# Patient Record
Sex: Male | Born: 1970
Health system: Southern US, Community
[De-identification: ages and names within clinical notes are randomized; demographics above are authoritative.]

---

## 2009-11-30 ENCOUNTER — Encounter: Admission: RE | Admit: 2009-11-30 | Discharge: 2010-02-28 | Payer: Self-pay | Admitting: Family Medicine

## 2012-02-11 DIAGNOSIS — M7989 Other specified soft tissue disorders: Secondary | ICD-10-CM | POA: Insufficient documentation

## 2012-02-11 DIAGNOSIS — W2209XA Striking against other stationary object, initial encounter: Secondary | ICD-10-CM | POA: Insufficient documentation

## 2012-02-11 DIAGNOSIS — S92309A Fracture of unspecified metatarsal bone(s), unspecified foot, initial encounter for closed fracture: Secondary | ICD-10-CM | POA: Insufficient documentation

## 2012-02-11 DIAGNOSIS — Y92009 Unspecified place in unspecified non-institutional (private) residence as the place of occurrence of the external cause: Secondary | ICD-10-CM | POA: Insufficient documentation

## 2012-02-12 ENCOUNTER — Encounter (HOSPITAL_COMMUNITY): Payer: Self-pay

## 2012-02-12 ENCOUNTER — Emergency Department (HOSPITAL_COMMUNITY)
Admission: EM | Admit: 2012-02-12 | Discharge: 2012-02-12 | Disposition: A | Discharge status: Home / Self Care | Payer: 59 | Attending: Emergency Medicine | Admitting: Emergency Medicine

## 2012-02-12 ENCOUNTER — Emergency Department (HOSPITAL_COMMUNITY): Payer: 59

## 2012-02-12 DIAGNOSIS — S92352A Displaced fracture of fifth metatarsal bone, left foot, initial encounter for closed fracture: Secondary | ICD-10-CM

## 2012-02-12 MED ORDER — BUPIVACAINE HCL (PF) 0.5 % IJ SOLN
INTRAMUSCULAR | Status: AC
  Filled 2012-02-12: qty 30

## 2012-02-12 MED ORDER — LIDOCAINE HCL 1 % IJ SOLN
30.0000 mL | Freq: Once | INTRAMUSCULAR | Status: DC
  Filled 2012-02-12: qty 20

## 2012-02-12 MED ORDER — OXYCODONE-ACETAMINOPHEN 5-325 MG PO TABS
1.0000 | ORAL_TABLET | Freq: Once | ORAL | Status: AC
  Administered 2012-02-12: 1 via ORAL
  Filled 2012-02-12: qty 1

## 2012-02-12 MED ORDER — IBUPROFEN 800 MG PO TABS
800.0000 mg | ORAL_TABLET | Freq: Once | ORAL | Status: AC
  Administered 2012-02-12: 800 mg via ORAL
  Filled 2012-02-12: qty 1

## 2012-02-12 MED ORDER — OXYCODONE-ACETAMINOPHEN 5-325 MG PO TABS: 1.0000 | ORAL_TABLET | ORAL | Status: AC | PRN

## 2012-02-12 NOTE — ED Notes (Signed)
Pt hit his left pinky toe on a door

## 2012-02-12 NOTE — ED Provider Notes (Signed)
History     CSN: [id number]  Arrival date & time 02/11/12  2235   First MD Initiated Contact with Patient 02/12/12 0106      Chief Complaint  Patient presents with  . Toe Pain    HPI: Patient is a 41 y.o. male presenting with toe pain. The history is provided by the patient.  Toe Pain This is a new problem. The current episode started yesterday. The problem has been gradually worsening. The symptoms are aggravated by walking.  Toe Pain This is a new problem. The current episode started yesterday. The problem has been gradually worsening. The symptoms are aggravated by walking.   patient reports that at approximately 10 PM this evening he was going through a doorway in his home relatively quickly and hit his left 5th toe on the molding the doorway. States originally the toe was more deformed in appearance and is now. Reports increasing pain and swelling.  History reviewed. No pertinent past medical history.  History reviewed. No pertinent past surgical history.  History reviewed. No pertinent family history.  History  Substance Use Topics  . Smoking status: Not on file  . Smokeless tobacco: Not on file  . Alcohol Use: No      Review of Systems  Constitutional: Negative.   HENT: Negative.   Eyes: Negative.   Respiratory: Negative.   Cardiovascular: Negative.   Gastrointestinal: Negative.   Genitourinary: Negative.   Musculoskeletal: Negative.   Skin: Negative.   Neurological: Negative.   Hematological: Negative.   Psychiatric/Behavioral: Negative.     Allergies  Review of patient's allergies indicates no known allergies.  Home Medications  No current outpatient prescriptions on file.  BP 134/92  Pulse 83  Temp(Src) 98.6 F (37 C) (Oral)  Resp 16  SpO2 98%  Physical Exam  Constitutional: He appears well-developed and well-nourished.  HENT:  Head: Normocephalic and atraumatic.  Eyes: Conjunctivae are normal.  Cardiovascular: Normal rate.     Pulmonary/Chest: Effort normal.  Musculoskeletal: Normal range of motion.       Feet:  Neurological: He is alert.  Skin: Skin is warm and dry.  Psychiatric: He has a normal mood and affect.    ED Course  Reduction of fracture Date/Time: 02/12/2012 4:30 AM Performed by: Leanne Chang Authorized by: Leanne Chang Consent: Verbal consent obtained. Risks and benefits: risks, benefits and alternatives were discussed Consent given by: patient Patient understanding: patient states understanding of the procedure being performed Required items: required blood products, implants, devices, and special equipment available Patient identity confirmed: verbally with patient Local anesthesia used: yes Anesthesia: digital block Local anesthetic: lidocaine 1% with epinephrine and bupivacaine 0.5% with epinephrine Anesthetic total: 3 ml Patient tolerance: Patient tolerated the procedure well with no immediate complications.   X-ray of the left fifth toe shows fracture of distal mid-metatarsal with approximately 45 angulation. Will attempt reduction. (See above) Dr. Dierdre Highman has reviewed the x-ray with me and is in agreement with plan.   Postreduction x-ray shows left fifth metatarsal in much better alignment status post reduction. Will buddy-tape and place in postop boot and encourage close follow up with his orthopedist. Patient is agreeable with plan     Labs Reviewed - No data to display No results found.   No diagnosis found.    MDM  HPI/PE and clinical findings c/w 1. Fx (L) 5th metatarsal  (Postreduction x-ray shows a much improved alignment of the left fifth metatarsal from approximately 45 angulation to approximately 10-15 angulation.  Betty tape applied and patient placed in postop boot. He has been instructed to followup with his orthopedist for reevaluation).      0  Leanne Chang, NP 02/12/12 5188  Leanne Chang, NP 02/12/12 0522  Medical  screening examination/treatment/procedure(s) were performed by non-physician practitioner and as supervising physician I was immediately available for consultation/collaboration.  Sunnie Nielsen, MD 02/13/12 [phone]

## 2012-02-12 NOTE — Discharge Instructions (Signed)
Please review the instructions below. The x-ray tonight showed that you had a displaced fracture in the bone in your left fifth toe. We were successful in reducing the angulation of the fracture from approximately 45 to 10-15. We have placed you in a buddy tape splint and postop boot. Call tomorrow to arrange follow up with your orthopedic physician. We are prescribing a short course of medication for pain please take as directed. Keep elevated as much as possible. Referred to the [facility] the instructions.     Foot Fracture Your caregiver has diagnosed you as having a foot fracture (broken bone). Your foot has many bones. You have a fracture, or break, in one of these bones. In some cases, your doctor may put on a splint or removable fracture boot until the swelling in your foot has lessened. A cast may or may not be required. HOME CARE INSTRUCTIONS  If you do not have a cast or splint:  You may bear weight on your injured foot as tolerated or advised.   Do not put any weight on your injured foot for as long as directed by your caregiver. Slowly increase the amount of time you walk on the foot as the pain and swelling allows or as advised.   Use crutches until you can bear weight without pain. A gradual increase in weight bearing may help.   Apply ice to the injury for 15 to 20 minutes each hour while awake for the first 2 days. Put the ice in a plastic bag and place a towel between the bag of ice and your skin.   If an ace bandage (stretchy, elastic wrapping bandage) was applied, you may re-wrap it if ankle is more painful or your toes become cold and swollen.  If you have a cast or splint:  Use your crutches for as long as directed by your caregiver.   To lessen the swelling, keep the injured foot elevated on pillows while lying down or sitting. Elevate your foot above your heart.   Apply ice to the injury for 15 to 20 minutes each hour while awake for the first 2 days. Put the ice in a  plastic bag and place a thin towel between the bag of ice and your cast.   Plaster or fiberglass cast:   Do not try to scratch the skin under the cast using a sharp or pointed object down the cast.   Check the skin around the cast every day. You may put lotion on any red or sore areas.   Keep your cast clean and dry.   Plaster splint:   Wear the splint until you are seen for a follow-up examination.   You may loosen the elastic around the splint if your toes become numb, tingle, or turn blue or cold. Do not rest it on anything harder than a pillow in the first 24 hours.   Do not put pressure on any part of your splint. Use your crutches as directed.   Keep your splint dry. It can be protected during bathing with a plastic bag. Do not lower the splint into water.   If you have a fracture boot you may remove it to shower. Bear weight only as instructed by your caregiver.   Only take over-the-counter or prescription medicines for pain, discomfort, or fever as directed by your caregiver.  SEEK IMMEDIATE MEDICAL CARE IF:   Your cast gets damaged or breaks.   You have continued severe pain or more swelling  than you did before the cast was put on.   Your skin or nails of your casted foot turn blue, gray, feel cold or numb.   There is a bad smell from your cast.   There is severe pain with movement of your toes.   There are new stains and/or drainage coming from under the cast.  MAKE SURE YOU:   Understand these instructions.   Will watch your condition.   Will get help right away if you are not doing well or get worse.  Document Released: 11/08/2000 Document Revised: 10/31/2011 Document Reviewed: 12/15/2008 [facility] Patient Information 2012 [place], [place].

## 2013-10-04 IMAGING — CR DG TOE 5TH 2+V*L*
3 series · 3 of 3 positions shown · non-contrast
Comparison: None.

CLINICAL DATA: Left fifth toe pain.

DG TOE 5TH LEFT

[x toes ap left]
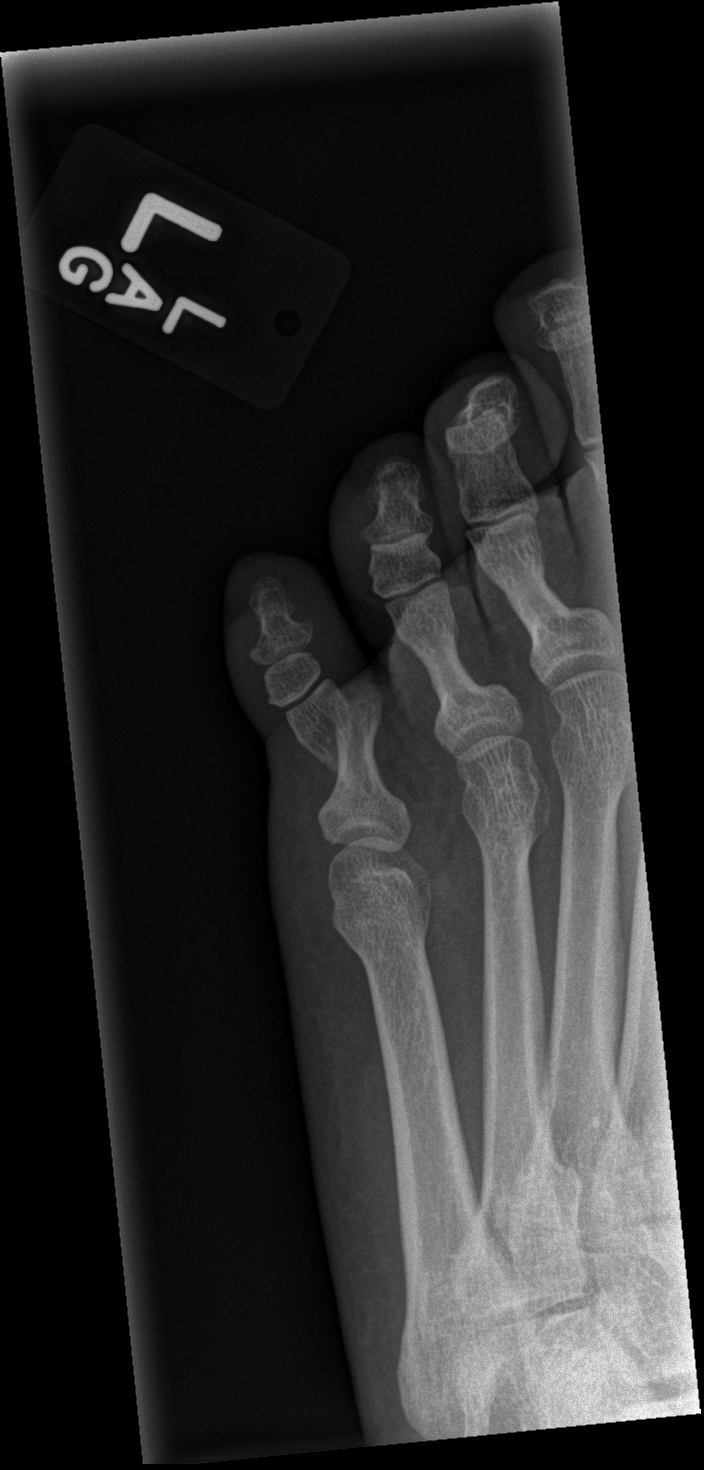

[x toes obl left]
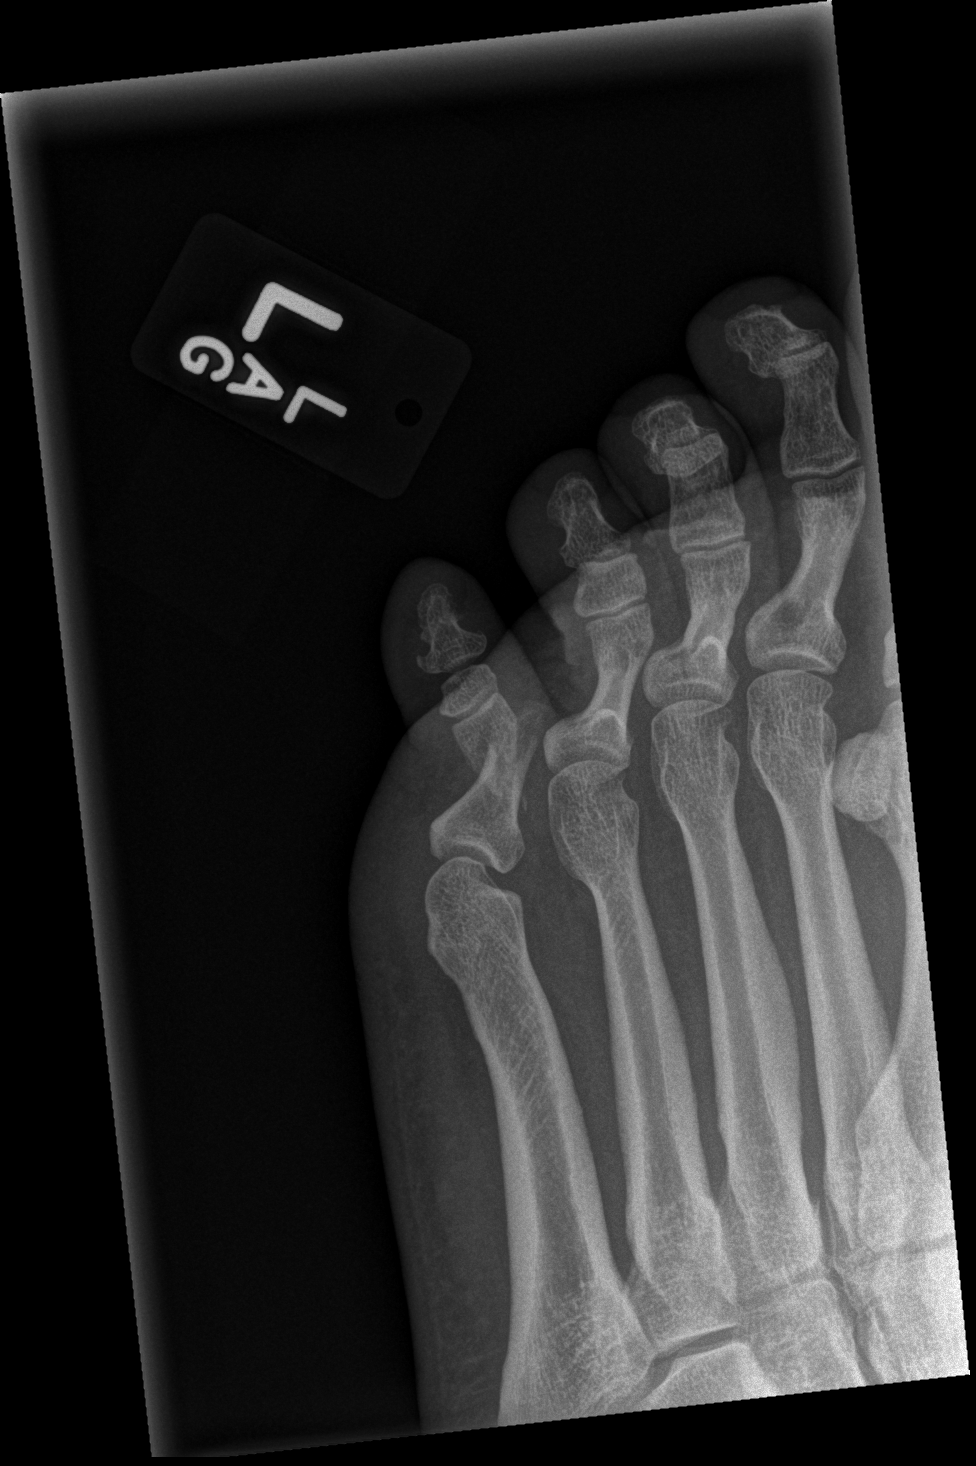

[x toes lat left]
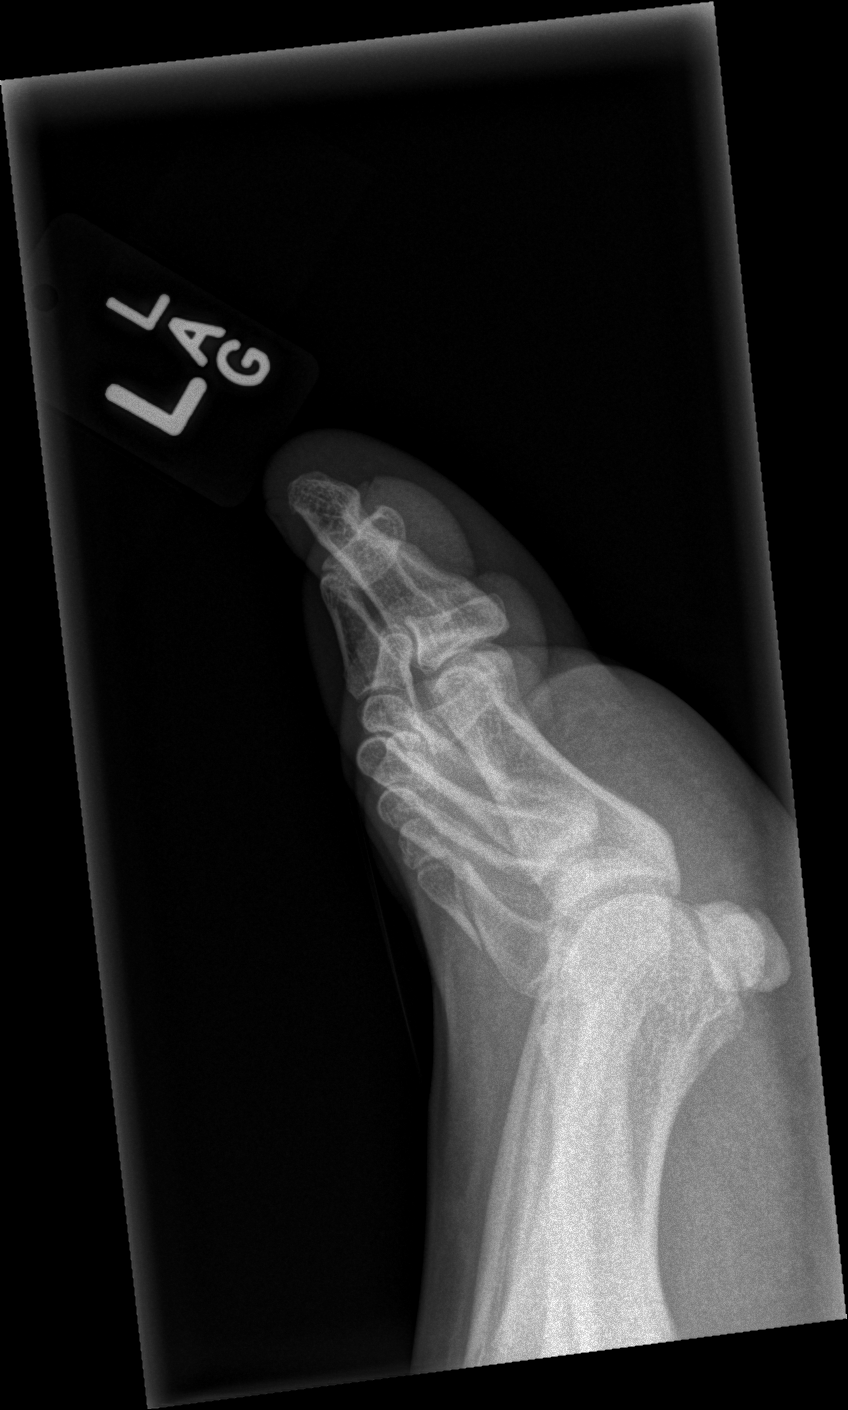

[3 of 3 positions shown; findings below may reference images not displayed]

FINDINGS: There is a significantly displaced fracture through the
fifth proximal phalanx, with lateral angulation.  No additional
fractures are seen.  The visualized joint spaces are preserved.
There is no evidence of intra-articular extension.

Overlying soft tissue swelling is noted.
IMPRESSION: Significantly displaced fracture through the fifth proximal
phalanx, with lateral angulation; no evidence of intra-articular
extension.

## 2016-07-30 DIAGNOSIS — M722 Plantar fascial fibromatosis: Secondary | ICD-10-CM | POA: Diagnosis not present

## 2016-07-30 DIAGNOSIS — M792 Neuralgia and neuritis, unspecified: Secondary | ICD-10-CM | POA: Diagnosis not present

## 2016-07-30 DIAGNOSIS — M7662 Achilles tendinitis, left leg: Secondary | ICD-10-CM | POA: Diagnosis not present

## 2016-08-20 DIAGNOSIS — M7662 Achilles tendinitis, left leg: Secondary | ICD-10-CM | POA: Diagnosis not present

## 2016-08-20 DIAGNOSIS — M722 Plantar fascial fibromatosis: Secondary | ICD-10-CM | POA: Diagnosis not present

## 2016-09-04 DIAGNOSIS — Z23 Encounter for immunization: Secondary | ICD-10-CM | POA: Diagnosis not present

## 2016-11-26 DIAGNOSIS — M7662 Achilles tendinitis, left leg: Secondary | ICD-10-CM | POA: Diagnosis not present

## 2016-11-26 DIAGNOSIS — M722 Plantar fascial fibromatosis: Secondary | ICD-10-CM | POA: Diagnosis not present

## 2016-11-26 DIAGNOSIS — M792 Neuralgia and neuritis, unspecified: Secondary | ICD-10-CM | POA: Diagnosis not present

## 2016-12-20 DIAGNOSIS — M7662 Achilles tendinitis, left leg: Secondary | ICD-10-CM | POA: Diagnosis not present

## 2016-12-20 DIAGNOSIS — M722 Plantar fascial fibromatosis: Secondary | ICD-10-CM | POA: Diagnosis not present

## 2016-12-24 DIAGNOSIS — M722 Plantar fascial fibromatosis: Secondary | ICD-10-CM | POA: Diagnosis not present

## 2016-12-24 DIAGNOSIS — M7662 Achilles tendinitis, left leg: Secondary | ICD-10-CM | POA: Diagnosis not present

## 2016-12-27 DIAGNOSIS — M722 Plantar fascial fibromatosis: Secondary | ICD-10-CM | POA: Diagnosis not present

## 2016-12-27 DIAGNOSIS — M7662 Achilles tendinitis, left leg: Secondary | ICD-10-CM | POA: Diagnosis not present

## 2017-01-02 DIAGNOSIS — M7662 Achilles tendinitis, left leg: Secondary | ICD-10-CM | POA: Diagnosis not present

## 2017-01-02 DIAGNOSIS — M722 Plantar fascial fibromatosis: Secondary | ICD-10-CM | POA: Diagnosis not present

## 2017-01-03 DIAGNOSIS — M7662 Achilles tendinitis, left leg: Secondary | ICD-10-CM | POA: Diagnosis not present

## 2017-01-03 DIAGNOSIS — M722 Plantar fascial fibromatosis: Secondary | ICD-10-CM | POA: Diagnosis not present

## 2017-01-07 DIAGNOSIS — M722 Plantar fascial fibromatosis: Secondary | ICD-10-CM | POA: Diagnosis not present

## 2017-01-07 DIAGNOSIS — M7662 Achilles tendinitis, left leg: Secondary | ICD-10-CM | POA: Diagnosis not present

## 2017-01-10 DIAGNOSIS — M722 Plantar fascial fibromatosis: Secondary | ICD-10-CM | POA: Diagnosis not present

## 2017-01-10 DIAGNOSIS — M7662 Achilles tendinitis, left leg: Secondary | ICD-10-CM | POA: Diagnosis not present

## 2017-01-14 DIAGNOSIS — M7662 Achilles tendinitis, left leg: Secondary | ICD-10-CM | POA: Diagnosis not present

## 2017-01-14 DIAGNOSIS — M722 Plantar fascial fibromatosis: Secondary | ICD-10-CM | POA: Diagnosis not present

## 2017-01-17 DIAGNOSIS — M722 Plantar fascial fibromatosis: Secondary | ICD-10-CM | POA: Diagnosis not present

## 2017-01-17 DIAGNOSIS — M7662 Achilles tendinitis, left leg: Secondary | ICD-10-CM | POA: Diagnosis not present

## 2017-01-21 DIAGNOSIS — M7662 Achilles tendinitis, left leg: Secondary | ICD-10-CM | POA: Diagnosis not present

## 2017-01-21 DIAGNOSIS — M722 Plantar fascial fibromatosis: Secondary | ICD-10-CM | POA: Diagnosis not present

## 2017-01-24 DIAGNOSIS — M722 Plantar fascial fibromatosis: Secondary | ICD-10-CM | POA: Diagnosis not present

## 2017-01-24 DIAGNOSIS — M7662 Achilles tendinitis, left leg: Secondary | ICD-10-CM | POA: Diagnosis not present

## 2017-01-31 DIAGNOSIS — M722 Plantar fascial fibromatosis: Secondary | ICD-10-CM | POA: Diagnosis not present

## 2017-01-31 DIAGNOSIS — M7662 Achilles tendinitis, left leg: Secondary | ICD-10-CM | POA: Diagnosis not present

## 2017-02-07 DIAGNOSIS — M7662 Achilles tendinitis, left leg: Secondary | ICD-10-CM | POA: Diagnosis not present

## 2017-02-07 DIAGNOSIS — M722 Plantar fascial fibromatosis: Secondary | ICD-10-CM | POA: Diagnosis not present

## 2017-02-13 DIAGNOSIS — M722 Plantar fascial fibromatosis: Secondary | ICD-10-CM | POA: Diagnosis not present

## 2017-02-13 DIAGNOSIS — M7662 Achilles tendinitis, left leg: Secondary | ICD-10-CM | POA: Diagnosis not present

## 2017-02-18 DIAGNOSIS — M722 Plantar fascial fibromatosis: Secondary | ICD-10-CM | POA: Diagnosis not present

## 2017-02-18 DIAGNOSIS — M7662 Achilles tendinitis, left leg: Secondary | ICD-10-CM | POA: Diagnosis not present

## 2017-02-27 DIAGNOSIS — M7662 Achilles tendinitis, left leg: Secondary | ICD-10-CM | POA: Diagnosis not present

## 2017-02-27 DIAGNOSIS — M722 Plantar fascial fibromatosis: Secondary | ICD-10-CM | POA: Diagnosis not present

## 2017-03-06 DIAGNOSIS — M722 Plantar fascial fibromatosis: Secondary | ICD-10-CM | POA: Diagnosis not present

## 2017-03-06 DIAGNOSIS — M7662 Achilles tendinitis, left leg: Secondary | ICD-10-CM | POA: Diagnosis not present

## 2017-03-13 DIAGNOSIS — M722 Plantar fascial fibromatosis: Secondary | ICD-10-CM | POA: Diagnosis not present

## 2017-03-13 DIAGNOSIS — M7662 Achilles tendinitis, left leg: Secondary | ICD-10-CM | POA: Diagnosis not present

## 2017-03-18 DIAGNOSIS — B356 Tinea cruris: Secondary | ICD-10-CM | POA: Diagnosis not present

## 2017-03-18 DIAGNOSIS — L03818 Cellulitis of other sites: Secondary | ICD-10-CM | POA: Diagnosis not present

## 2017-03-18 DIAGNOSIS — R030 Elevated blood-pressure reading, without diagnosis of hypertension: Secondary | ICD-10-CM | POA: Diagnosis not present

## 2017-03-20 DIAGNOSIS — M722 Plantar fascial fibromatosis: Secondary | ICD-10-CM | POA: Diagnosis not present

## 2017-03-20 DIAGNOSIS — M7662 Achilles tendinitis, left leg: Secondary | ICD-10-CM | POA: Diagnosis not present

## 2017-03-21 DIAGNOSIS — L309 Dermatitis, unspecified: Secondary | ICD-10-CM | POA: Diagnosis not present

## 2017-03-21 DIAGNOSIS — L03818 Cellulitis of other sites: Secondary | ICD-10-CM | POA: Diagnosis not present

## 2017-03-21 DIAGNOSIS — B356 Tinea cruris: Secondary | ICD-10-CM | POA: Diagnosis not present

## 2017-03-24 DIAGNOSIS — B356 Tinea cruris: Secondary | ICD-10-CM | POA: Diagnosis not present

## 2017-03-24 DIAGNOSIS — L309 Dermatitis, unspecified: Secondary | ICD-10-CM | POA: Diagnosis not present

## 2017-03-24 DIAGNOSIS — L03818 Cellulitis of other sites: Secondary | ICD-10-CM | POA: Diagnosis not present

## 2017-05-14 DIAGNOSIS — Z0001 Encounter for general adult medical examination with abnormal findings: Secondary | ICD-10-CM | POA: Diagnosis not present

## 2017-05-15 DIAGNOSIS — Z131 Encounter for screening for diabetes mellitus: Secondary | ICD-10-CM | POA: Diagnosis not present

## 2017-05-15 DIAGNOSIS — Z0000 Encounter for general adult medical examination without abnormal findings: Secondary | ICD-10-CM | POA: Diagnosis not present

## 2017-05-15 DIAGNOSIS — Z13220 Encounter for screening for lipoid disorders: Secondary | ICD-10-CM | POA: Diagnosis not present

## 2017-05-18 DIAGNOSIS — L02214 Cutaneous abscess of groin: Secondary | ICD-10-CM | POA: Diagnosis not present

## 2017-06-14 DIAGNOSIS — M791 Myalgia: Secondary | ICD-10-CM | POA: Diagnosis not present

## 2017-06-18 DIAGNOSIS — S92502A Displaced unspecified fracture of left lesser toe(s), initial encounter for closed fracture: Secondary | ICD-10-CM | POA: Diagnosis not present

## 2017-07-02 ENCOUNTER — Ambulatory Visit: Payer: BLUE CROSS/BLUE SHIELD | Admitting: Physical Therapy

## 2017-07-02 DIAGNOSIS — M791 Myalgia: Secondary | ICD-10-CM | POA: Diagnosis not present

## 2017-07-02 DIAGNOSIS — M545 Low back pain: Secondary | ICD-10-CM | POA: Diagnosis not present

## 2017-07-18 DIAGNOSIS — M7660 Achilles tendinitis, unspecified leg: Secondary | ICD-10-CM | POA: Diagnosis not present

## 2017-07-23 DIAGNOSIS — M7660 Achilles tendinitis, unspecified leg: Secondary | ICD-10-CM | POA: Diagnosis not present

## 2017-07-30 DIAGNOSIS — M7660 Achilles tendinitis, unspecified leg: Secondary | ICD-10-CM | POA: Diagnosis not present

## 2017-08-12 DIAGNOSIS — M7660 Achilles tendinitis, unspecified leg: Secondary | ICD-10-CM | POA: Diagnosis not present

## 2017-09-11 DIAGNOSIS — K602 Anal fissure, unspecified: Secondary | ICD-10-CM | POA: Diagnosis not present

## 2017-11-11 DIAGNOSIS — M7662 Achilles tendinitis, left leg: Secondary | ICD-10-CM | POA: Diagnosis not present

## 2017-12-01 DIAGNOSIS — M9905 Segmental and somatic dysfunction of pelvic region: Secondary | ICD-10-CM | POA: Diagnosis not present

## 2017-12-01 DIAGNOSIS — M2560 Stiffness of unspecified joint, not elsewhere classified: Secondary | ICD-10-CM | POA: Diagnosis not present

## 2017-12-01 DIAGNOSIS — R293 Abnormal posture: Secondary | ICD-10-CM | POA: Diagnosis not present

## 2017-12-01 DIAGNOSIS — M545 Low back pain: Secondary | ICD-10-CM | POA: Diagnosis not present

## 2017-12-05 ENCOUNTER — Telehealth: Payer: Self-pay

## 2017-12-05 DIAGNOSIS — M545 Low back pain: Secondary | ICD-10-CM | POA: Diagnosis not present

## 2017-12-05 DIAGNOSIS — M2560 Stiffness of unspecified joint, not elsewhere classified: Secondary | ICD-10-CM | POA: Diagnosis not present

## 2017-12-05 DIAGNOSIS — R293 Abnormal posture: Secondary | ICD-10-CM | POA: Diagnosis not present

## 2017-12-05 DIAGNOSIS — M9905 Segmental and somatic dysfunction of pelvic region: Secondary | ICD-10-CM | POA: Diagnosis not present

## 2017-12-05 NOTE — Telephone Encounter (Signed)
Received a call from [facility]. Pts Prilosec 20mg  bid will not be covered unless rx is changed to Prilosec 40 mg once daily.  Spoke with EG and a PA is ok to do. If PA isn't approved, we can try calling in Dexilant 60 mg once daily per EG.  Waiting on PA form from the pharmacy.

## 2017-12-08 DIAGNOSIS — M545 Low back pain: Secondary | ICD-10-CM | POA: Diagnosis not present

## 2017-12-08 DIAGNOSIS — M2560 Stiffness of unspecified joint, not elsewhere classified: Secondary | ICD-10-CM | POA: Diagnosis not present

## 2017-12-08 DIAGNOSIS — R293 Abnormal posture: Secondary | ICD-10-CM | POA: Diagnosis not present

## 2017-12-08 DIAGNOSIS — M9905 Segmental and somatic dysfunction of pelvic region: Secondary | ICD-10-CM | POA: Diagnosis not present

## 2017-12-11 DIAGNOSIS — M545 Low back pain: Secondary | ICD-10-CM | POA: Diagnosis not present

## 2017-12-11 DIAGNOSIS — M9905 Segmental and somatic dysfunction of pelvic region: Secondary | ICD-10-CM | POA: Diagnosis not present

## 2017-12-11 DIAGNOSIS — R293 Abnormal posture: Secondary | ICD-10-CM | POA: Diagnosis not present

## 2017-12-11 DIAGNOSIS — M2560 Stiffness of unspecified joint, not elsewhere classified: Secondary | ICD-10-CM | POA: Diagnosis not present

## 2017-12-12 DIAGNOSIS — R293 Abnormal posture: Secondary | ICD-10-CM | POA: Diagnosis not present

## 2017-12-12 DIAGNOSIS — M545 Low back pain: Secondary | ICD-10-CM | POA: Diagnosis not present

## 2017-12-12 DIAGNOSIS — M9903 Segmental and somatic dysfunction of lumbar region: Secondary | ICD-10-CM | POA: Diagnosis not present

## 2017-12-12 DIAGNOSIS — M2560 Stiffness of unspecified joint, not elsewhere classified: Secondary | ICD-10-CM | POA: Diagnosis not present

## 2017-12-15 DIAGNOSIS — M9903 Segmental and somatic dysfunction of lumbar region: Secondary | ICD-10-CM | POA: Diagnosis not present

## 2017-12-15 DIAGNOSIS — M2560 Stiffness of unspecified joint, not elsewhere classified: Secondary | ICD-10-CM | POA: Diagnosis not present

## 2017-12-15 DIAGNOSIS — M545 Low back pain: Secondary | ICD-10-CM | POA: Diagnosis not present

## 2017-12-15 DIAGNOSIS — R293 Abnormal posture: Secondary | ICD-10-CM | POA: Diagnosis not present

## 2017-12-18 DIAGNOSIS — M545 Low back pain: Secondary | ICD-10-CM | POA: Diagnosis not present

## 2017-12-18 DIAGNOSIS — M9903 Segmental and somatic dysfunction of lumbar region: Secondary | ICD-10-CM | POA: Diagnosis not present

## 2017-12-18 DIAGNOSIS — R293 Abnormal posture: Secondary | ICD-10-CM | POA: Diagnosis not present

## 2017-12-18 DIAGNOSIS — M2560 Stiffness of unspecified joint, not elsewhere classified: Secondary | ICD-10-CM | POA: Diagnosis not present

## 2017-12-19 DIAGNOSIS — M2560 Stiffness of unspecified joint, not elsewhere classified: Secondary | ICD-10-CM | POA: Diagnosis not present

## 2017-12-19 DIAGNOSIS — M9903 Segmental and somatic dysfunction of lumbar region: Secondary | ICD-10-CM | POA: Diagnosis not present

## 2017-12-19 DIAGNOSIS — M545 Low back pain: Secondary | ICD-10-CM | POA: Diagnosis not present

## 2017-12-19 DIAGNOSIS — R293 Abnormal posture: Secondary | ICD-10-CM | POA: Diagnosis not present

## 2017-12-22 DIAGNOSIS — M9903 Segmental and somatic dysfunction of lumbar region: Secondary | ICD-10-CM | POA: Diagnosis not present

## 2017-12-22 DIAGNOSIS — M2560 Stiffness of unspecified joint, not elsewhere classified: Secondary | ICD-10-CM | POA: Diagnosis not present

## 2017-12-22 DIAGNOSIS — R293 Abnormal posture: Secondary | ICD-10-CM | POA: Diagnosis not present

## 2017-12-22 DIAGNOSIS — M545 Low back pain: Secondary | ICD-10-CM | POA: Diagnosis not present

## 2017-12-24 DIAGNOSIS — M2560 Stiffness of unspecified joint, not elsewhere classified: Secondary | ICD-10-CM | POA: Diagnosis not present

## 2017-12-24 DIAGNOSIS — M9903 Segmental and somatic dysfunction of lumbar region: Secondary | ICD-10-CM | POA: Diagnosis not present

## 2017-12-24 DIAGNOSIS — M545 Low back pain: Secondary | ICD-10-CM | POA: Diagnosis not present

## 2017-12-24 DIAGNOSIS — R293 Abnormal posture: Secondary | ICD-10-CM | POA: Diagnosis not present

## 2017-12-26 DIAGNOSIS — M545 Low back pain: Secondary | ICD-10-CM | POA: Diagnosis not present

## 2017-12-26 DIAGNOSIS — M9903 Segmental and somatic dysfunction of lumbar region: Secondary | ICD-10-CM | POA: Diagnosis not present

## 2017-12-26 DIAGNOSIS — R293 Abnormal posture: Secondary | ICD-10-CM | POA: Diagnosis not present

## 2017-12-26 DIAGNOSIS — M2560 Stiffness of unspecified joint, not elsewhere classified: Secondary | ICD-10-CM | POA: Diagnosis not present

## 2017-12-29 DIAGNOSIS — R293 Abnormal posture: Secondary | ICD-10-CM | POA: Diagnosis not present

## 2017-12-29 DIAGNOSIS — M2560 Stiffness of unspecified joint, not elsewhere classified: Secondary | ICD-10-CM | POA: Diagnosis not present

## 2017-12-29 DIAGNOSIS — M545 Low back pain: Secondary | ICD-10-CM | POA: Diagnosis not present

## 2017-12-29 DIAGNOSIS — M9903 Segmental and somatic dysfunction of lumbar region: Secondary | ICD-10-CM | POA: Diagnosis not present

## 2017-12-31 DIAGNOSIS — M545 Low back pain: Secondary | ICD-10-CM | POA: Diagnosis not present

## 2017-12-31 DIAGNOSIS — M2560 Stiffness of unspecified joint, not elsewhere classified: Secondary | ICD-10-CM | POA: Diagnosis not present

## 2017-12-31 DIAGNOSIS — R293 Abnormal posture: Secondary | ICD-10-CM | POA: Diagnosis not present

## 2017-12-31 DIAGNOSIS — M9903 Segmental and somatic dysfunction of lumbar region: Secondary | ICD-10-CM | POA: Diagnosis not present

## 2018-01-02 DIAGNOSIS — R293 Abnormal posture: Secondary | ICD-10-CM | POA: Diagnosis not present

## 2018-01-02 DIAGNOSIS — M2560 Stiffness of unspecified joint, not elsewhere classified: Secondary | ICD-10-CM | POA: Diagnosis not present

## 2018-01-02 DIAGNOSIS — M9903 Segmental and somatic dysfunction of lumbar region: Secondary | ICD-10-CM | POA: Diagnosis not present

## 2018-01-02 DIAGNOSIS — M25472 Effusion, left ankle: Secondary | ICD-10-CM | POA: Diagnosis not present

## 2018-01-05 DIAGNOSIS — R293 Abnormal posture: Secondary | ICD-10-CM | POA: Diagnosis not present

## 2018-01-05 DIAGNOSIS — M25472 Effusion, left ankle: Secondary | ICD-10-CM | POA: Diagnosis not present

## 2018-01-05 DIAGNOSIS — M2560 Stiffness of unspecified joint, not elsewhere classified: Secondary | ICD-10-CM | POA: Diagnosis not present

## 2018-01-05 DIAGNOSIS — M9903 Segmental and somatic dysfunction of lumbar region: Secondary | ICD-10-CM | POA: Diagnosis not present

## 2018-05-27 DIAGNOSIS — Z0000 Encounter for general adult medical examination without abnormal findings: Secondary | ICD-10-CM | POA: Diagnosis not present

## 2018-05-27 DIAGNOSIS — E669 Obesity, unspecified: Secondary | ICD-10-CM | POA: Diagnosis not present

## 2018-05-27 DIAGNOSIS — H9191 Unspecified hearing loss, right ear: Secondary | ICD-10-CM | POA: Diagnosis not present

## 2018-05-27 DIAGNOSIS — Z23 Encounter for immunization: Secondary | ICD-10-CM | POA: Diagnosis not present

## 2018-05-27 DIAGNOSIS — L409 Psoriasis, unspecified: Secondary | ICD-10-CM | POA: Diagnosis not present

## 2018-05-27 DIAGNOSIS — H9192 Unspecified hearing loss, left ear: Secondary | ICD-10-CM | POA: Diagnosis not present

## 2018-06-02 DIAGNOSIS — L409 Psoriasis, unspecified: Secondary | ICD-10-CM | POA: Diagnosis not present

## 2018-06-02 DIAGNOSIS — R739 Hyperglycemia, unspecified: Secondary | ICD-10-CM | POA: Diagnosis not present

## 2018-06-02 DIAGNOSIS — Z13220 Encounter for screening for lipoid disorders: Secondary | ICD-10-CM | POA: Diagnosis not present

## 2018-06-02 DIAGNOSIS — D696 Thrombocytopenia, unspecified: Secondary | ICD-10-CM | POA: Diagnosis not present

## 2018-06-02 DIAGNOSIS — Z1159 Encounter for screening for other viral diseases: Secondary | ICD-10-CM | POA: Diagnosis not present

## 2019-06-01 DIAGNOSIS — R7303 Prediabetes: Secondary | ICD-10-CM | POA: Diagnosis not present

## 2019-06-01 DIAGNOSIS — Z136 Encounter for screening for cardiovascular disorders: Secondary | ICD-10-CM | POA: Diagnosis not present

## 2019-06-01 DIAGNOSIS — E669 Obesity, unspecified: Secondary | ICD-10-CM | POA: Diagnosis not present

## 2019-06-01 DIAGNOSIS — Z0000 Encounter for general adult medical examination without abnormal findings: Secondary | ICD-10-CM | POA: Diagnosis not present

## 2019-06-01 DIAGNOSIS — D696 Thrombocytopenia, unspecified: Secondary | ICD-10-CM | POA: Diagnosis not present

## 2019-06-01 DIAGNOSIS — E790 Hyperuricemia without signs of inflammatory arthritis and tophaceous disease: Secondary | ICD-10-CM | POA: Diagnosis not present

## 2019-06-01 DIAGNOSIS — R739 Hyperglycemia, unspecified: Secondary | ICD-10-CM | POA: Diagnosis not present

## 2019-07-02 DIAGNOSIS — Z20828 Contact with and (suspected) exposure to other viral communicable diseases: Secondary | ICD-10-CM | POA: Diagnosis not present

## 2019-07-02 DIAGNOSIS — Z03818 Encounter for observation for suspected exposure to other biological agents ruled out: Secondary | ICD-10-CM | POA: Diagnosis not present

## 2019-07-02 DIAGNOSIS — Z7189 Other specified counseling: Secondary | ICD-10-CM | POA: Diagnosis not present

## 2019-08-06 DIAGNOSIS — Z7189 Other specified counseling: Secondary | ICD-10-CM | POA: Diagnosis not present

## 2019-08-06 DIAGNOSIS — Z20828 Contact with and (suspected) exposure to other viral communicable diseases: Secondary | ICD-10-CM | POA: Diagnosis not present

## 2019-08-19 DIAGNOSIS — Z23 Encounter for immunization: Secondary | ICD-10-CM | POA: Diagnosis not present

## 2019-09-09 DIAGNOSIS — Z7189 Other specified counseling: Secondary | ICD-10-CM | POA: Diagnosis not present

## 2019-09-09 DIAGNOSIS — Z20828 Contact with and (suspected) exposure to other viral communicable diseases: Secondary | ICD-10-CM | POA: Diagnosis not present

## 2019-10-01 DIAGNOSIS — Z03818 Encounter for observation for suspected exposure to other biological agents ruled out: Secondary | ICD-10-CM | POA: Diagnosis not present

## 2019-10-01 DIAGNOSIS — Z20828 Contact with and (suspected) exposure to other viral communicable diseases: Secondary | ICD-10-CM | POA: Diagnosis not present

## 2019-10-01 DIAGNOSIS — Z7189 Other specified counseling: Secondary | ICD-10-CM | POA: Diagnosis not present

## 2019-12-02 DIAGNOSIS — E790 Hyperuricemia without signs of inflammatory arthritis and tophaceous disease: Secondary | ICD-10-CM | POA: Diagnosis not present

## 2019-12-02 DIAGNOSIS — R739 Hyperglycemia, unspecified: Secondary | ICD-10-CM | POA: Diagnosis not present

## 2019-12-17 DIAGNOSIS — R739 Hyperglycemia, unspecified: Secondary | ICD-10-CM | POA: Diagnosis not present

## 2019-12-17 DIAGNOSIS — E790 Hyperuricemia without signs of inflammatory arthritis and tophaceous disease: Secondary | ICD-10-CM | POA: Diagnosis not present

## 2019-12-24 DIAGNOSIS — Z03818 Encounter for observation for suspected exposure to other biological agents ruled out: Secondary | ICD-10-CM | POA: Diagnosis not present

## 2020-01-05 DIAGNOSIS — Z7189 Other specified counseling: Secondary | ICD-10-CM | POA: Diagnosis not present

## 2020-01-05 DIAGNOSIS — Z20828 Contact with and (suspected) exposure to other viral communicable diseases: Secondary | ICD-10-CM | POA: Diagnosis not present

## 2020-02-03 ENCOUNTER — Ambulatory Visit: Payer: Self-pay | Attending: Internal Medicine

## 2020-02-03 DIAGNOSIS — Z23 Encounter for immunization: Secondary | ICD-10-CM

## 2020-02-03 NOTE — Progress Notes (Signed)
   Covid-19 Vaccination Clinic  Name:  Salbador Fiveash    MRN: [id number] DOB: [birth year]  02/03/2020  Mr. Wattenbarger was observed post Covid-19 immunization for 15 minutes without incident. He was provided with Vaccine Information Sheet and instruction to access the V-Safe system.   Mr. Xue was instructed to call 911 with any severe reactions post vaccine: Marland Kitchen Difficulty breathing  . Swelling of face and throat  . A fast heartbeat  . A bad rash all over body  . Dizziness and weakness   Immunizations Administered    Name Date Dose VIS Date Route   Pfizer COVID-19 Vaccine 02/03/2020  4:25 PM 0.3 mL 11/05/2019 Intramuscular   Manufacturer: [employer], [employer]   Lot: [id number]   NDC: [address]-6

## 2020-02-22 DIAGNOSIS — Z03818 Encounter for observation for suspected exposure to other biological agents ruled out: Secondary | ICD-10-CM | POA: Diagnosis not present

## 2020-02-28 ENCOUNTER — Ambulatory Visit: Payer: Self-pay | Attending: Internal Medicine

## 2020-02-28 DIAGNOSIS — Z23 Encounter for immunization: Secondary | ICD-10-CM

## 2020-02-28 NOTE — Progress Notes (Signed)
   Covid-19 Vaccination Clinic  Name:  Thomas Mcpherson    MRN: [id number] DOB: 11-28-1970  02/28/2020  Mr. Broad was observed post Covid-19 immunization for 15 minutes without incident. He was provided with Vaccine Information Sheet and instruction to access the V-Safe system.   [name] was instructed to call 911 with any severe reactions post vaccine: Marland Kitchen Difficulty breathing  . Swelling of face and throat  . A fast heartbeat  . A bad rash all over body  . Dizziness and weakness   Immunizations Administered    Name Date Dose VIS Date Route   Pfizer COVID-19 Vaccine 02/28/2020  2:32 PM 0.3 mL 11/05/2019 Intramuscular   Manufacturer: [employer], [employer]   Lot: [id number]   NDC: [address]-8

## 2020-03-14 DIAGNOSIS — Z03818 Encounter for observation for suspected exposure to other biological agents ruled out: Secondary | ICD-10-CM | POA: Diagnosis not present

## 2020-06-06 DIAGNOSIS — Z125 Encounter for screening for malignant neoplasm of prostate: Secondary | ICD-10-CM | POA: Diagnosis not present

## 2020-06-06 DIAGNOSIS — D696 Thrombocytopenia, unspecified: Secondary | ICD-10-CM | POA: Diagnosis not present

## 2020-06-06 DIAGNOSIS — E790 Hyperuricemia without signs of inflammatory arthritis and tophaceous disease: Secondary | ICD-10-CM | POA: Diagnosis not present

## 2020-06-06 DIAGNOSIS — Z23 Encounter for immunization: Secondary | ICD-10-CM | POA: Diagnosis not present

## 2020-06-06 DIAGNOSIS — R739 Hyperglycemia, unspecified: Secondary | ICD-10-CM | POA: Diagnosis not present

## 2020-06-06 DIAGNOSIS — Z13220 Encounter for screening for lipoid disorders: Secondary | ICD-10-CM | POA: Diagnosis not present

## 2020-06-06 DIAGNOSIS — Z0000 Encounter for general adult medical examination without abnormal findings: Secondary | ICD-10-CM | POA: Diagnosis not present

## 2020-07-06 DIAGNOSIS — Z03818 Encounter for observation for suspected exposure to other biological agents ruled out: Secondary | ICD-10-CM | POA: Diagnosis not present

## 2020-08-08 DIAGNOSIS — Z03818 Encounter for observation for suspected exposure to other biological agents ruled out: Secondary | ICD-10-CM | POA: Diagnosis not present

## 2020-08-29 DIAGNOSIS — Z03818 Encounter for observation for suspected exposure to other biological agents ruled out: Secondary | ICD-10-CM | POA: Diagnosis not present

## 2020-09-08 DIAGNOSIS — Z23 Encounter for immunization: Secondary | ICD-10-CM | POA: Diagnosis not present

## 2020-09-19 DIAGNOSIS — Z23 Encounter for immunization: Secondary | ICD-10-CM | POA: Diagnosis not present

## 2020-09-22 DIAGNOSIS — Z03818 Encounter for observation for suspected exposure to other biological agents ruled out: Secondary | ICD-10-CM | POA: Diagnosis not present

## 2020-09-27 DIAGNOSIS — Z23 Encounter for immunization: Secondary | ICD-10-CM | POA: Diagnosis not present

## 2020-10-31 DIAGNOSIS — Z03818 Encounter for observation for suspected exposure to other biological agents ruled out: Secondary | ICD-10-CM | POA: Diagnosis not present

## 2020-11-06 DIAGNOSIS — E790 Hyperuricemia without signs of inflammatory arthritis and tophaceous disease: Secondary | ICD-10-CM | POA: Diagnosis not present

## 2020-11-06 DIAGNOSIS — R739 Hyperglycemia, unspecified: Secondary | ICD-10-CM | POA: Diagnosis not present

## 2020-11-06 DIAGNOSIS — D696 Thrombocytopenia, unspecified: Secondary | ICD-10-CM | POA: Diagnosis not present

## 2020-11-06 DIAGNOSIS — E785 Hyperlipidemia, unspecified: Secondary | ICD-10-CM | POA: Diagnosis not present

## 2020-11-06 DIAGNOSIS — H9191 Unspecified hearing loss, right ear: Secondary | ICD-10-CM | POA: Diagnosis not present

## 2020-11-06 DIAGNOSIS — L409 Psoriasis, unspecified: Secondary | ICD-10-CM | POA: Diagnosis not present

## 2020-11-06 DIAGNOSIS — H9192 Unspecified hearing loss, left ear: Secondary | ICD-10-CM | POA: Diagnosis not present

## 2020-11-27 DIAGNOSIS — Z03818 Encounter for observation for suspected exposure to other biological agents ruled out: Secondary | ICD-10-CM | POA: Diagnosis not present

## 2020-12-04 DIAGNOSIS — Z20822 Contact with and (suspected) exposure to covid-19: Secondary | ICD-10-CM | POA: Diagnosis not present

## 2020-12-04 DIAGNOSIS — Z03818 Encounter for observation for suspected exposure to other biological agents ruled out: Secondary | ICD-10-CM | POA: Diagnosis not present

## 2021-02-05 DIAGNOSIS — Z03818 Encounter for observation for suspected exposure to other biological agents ruled out: Secondary | ICD-10-CM | POA: Diagnosis not present

## 2021-03-06 DIAGNOSIS — Z03818 Encounter for observation for suspected exposure to other biological agents ruled out: Secondary | ICD-10-CM | POA: Diagnosis not present

## 2021-03-08 DIAGNOSIS — L409 Psoriasis, unspecified: Secondary | ICD-10-CM | POA: Diagnosis not present

## 2021-03-08 DIAGNOSIS — E785 Hyperlipidemia, unspecified: Secondary | ICD-10-CM | POA: Diagnosis not present

## 2021-03-08 DIAGNOSIS — H9191 Unspecified hearing loss, right ear: Secondary | ICD-10-CM | POA: Diagnosis not present

## 2021-03-08 DIAGNOSIS — H9192 Unspecified hearing loss, left ear: Secondary | ICD-10-CM | POA: Diagnosis not present

## 2021-03-15 DIAGNOSIS — R739 Hyperglycemia, unspecified: Secondary | ICD-10-CM | POA: Diagnosis not present

## 2021-03-15 DIAGNOSIS — E790 Hyperuricemia without signs of inflammatory arthritis and tophaceous disease: Secondary | ICD-10-CM | POA: Diagnosis not present

## 2021-03-15 DIAGNOSIS — R030 Elevated blood-pressure reading, without diagnosis of hypertension: Secondary | ICD-10-CM | POA: Diagnosis not present

## 2021-03-15 DIAGNOSIS — E785 Hyperlipidemia, unspecified: Secondary | ICD-10-CM | POA: Diagnosis not present

## 2021-03-15 DIAGNOSIS — D696 Thrombocytopenia, unspecified: Secondary | ICD-10-CM | POA: Diagnosis not present

## 2021-06-07 DIAGNOSIS — L739 Follicular disorder, unspecified: Secondary | ICD-10-CM | POA: Diagnosis not present

## 2021-06-12 DIAGNOSIS — D696 Thrombocytopenia, unspecified: Secondary | ICD-10-CM | POA: Diagnosis not present

## 2021-06-12 DIAGNOSIS — R739 Hyperglycemia, unspecified: Secondary | ICD-10-CM | POA: Diagnosis not present

## 2021-06-12 DIAGNOSIS — Z0000 Encounter for general adult medical examination without abnormal findings: Secondary | ICD-10-CM | POA: Diagnosis not present

## 2021-06-12 DIAGNOSIS — E785 Hyperlipidemia, unspecified: Secondary | ICD-10-CM | POA: Diagnosis not present

## 2021-06-12 DIAGNOSIS — E790 Hyperuricemia without signs of inflammatory arthritis and tophaceous disease: Secondary | ICD-10-CM | POA: Diagnosis not present

## 2021-06-12 DIAGNOSIS — Z125 Encounter for screening for malignant neoplasm of prostate: Secondary | ICD-10-CM | POA: Diagnosis not present

## 2021-06-28 DIAGNOSIS — Z1211 Encounter for screening for malignant neoplasm of colon: Secondary | ICD-10-CM | POA: Diagnosis not present

## 2022-01-14 DIAGNOSIS — J3489 Other specified disorders of nose and nasal sinuses: Secondary | ICD-10-CM | POA: Diagnosis not present

## 2022-01-14 DIAGNOSIS — R040 Epistaxis: Secondary | ICD-10-CM | POA: Diagnosis not present

## 2022-03-04 DIAGNOSIS — R040 Epistaxis: Secondary | ICD-10-CM | POA: Diagnosis not present

## 2022-04-18 DIAGNOSIS — D124 Benign neoplasm of descending colon: Secondary | ICD-10-CM | POA: Diagnosis not present

## 2022-04-18 DIAGNOSIS — K648 Other hemorrhoids: Secondary | ICD-10-CM | POA: Diagnosis not present

## 2022-04-18 DIAGNOSIS — Z1211 Encounter for screening for malignant neoplasm of colon: Secondary | ICD-10-CM | POA: Diagnosis not present

## 2022-04-18 DIAGNOSIS — K5730 Diverticulosis of large intestine without perforation or abscess without bleeding: Secondary | ICD-10-CM | POA: Diagnosis not present

## 2022-08-02 DIAGNOSIS — S46812A Strain of other muscles, fascia and tendons at shoulder and upper arm level, left arm, initial encounter: Secondary | ICD-10-CM | POA: Diagnosis not present

## 2022-08-02 DIAGNOSIS — Z23 Encounter for immunization: Secondary | ICD-10-CM | POA: Diagnosis not present

## 2022-09-26 DIAGNOSIS — M79644 Pain in right finger(s): Secondary | ICD-10-CM | POA: Diagnosis not present

## 2022-09-26 DIAGNOSIS — E785 Hyperlipidemia, unspecified: Secondary | ICD-10-CM | POA: Diagnosis not present

## 2022-09-26 DIAGNOSIS — Z0000 Encounter for general adult medical examination without abnormal findings: Secondary | ICD-10-CM | POA: Diagnosis not present

## 2022-09-26 DIAGNOSIS — E790 Hyperuricemia without signs of inflammatory arthritis and tophaceous disease: Secondary | ICD-10-CM | POA: Diagnosis not present

## 2022-09-26 DIAGNOSIS — G4730 Sleep apnea, unspecified: Secondary | ICD-10-CM | POA: Diagnosis not present

## 2022-10-24 DIAGNOSIS — G4719 Other hypersomnia: Secondary | ICD-10-CM | POA: Diagnosis not present

## 2022-10-24 DIAGNOSIS — E669 Obesity, unspecified: Secondary | ICD-10-CM | POA: Diagnosis not present

## 2022-12-12 DIAGNOSIS — L821 Other seborrheic keratosis: Secondary | ICD-10-CM | POA: Diagnosis not present

## 2022-12-12 DIAGNOSIS — B353 Tinea pedis: Secondary | ICD-10-CM | POA: Diagnosis not present

## 2022-12-12 DIAGNOSIS — L814 Other melanin hyperpigmentation: Secondary | ICD-10-CM | POA: Diagnosis not present

## 2022-12-12 DIAGNOSIS — D225 Melanocytic nevi of trunk: Secondary | ICD-10-CM | POA: Diagnosis not present

## 2022-12-16 DIAGNOSIS — G4733 Obstructive sleep apnea (adult) (pediatric): Secondary | ICD-10-CM | POA: Diagnosis not present

## 2023-01-23 DIAGNOSIS — G4733 Obstructive sleep apnea (adult) (pediatric): Secondary | ICD-10-CM | POA: Diagnosis not present

## 2023-01-23 DIAGNOSIS — R400 Somnolence: Secondary | ICD-10-CM | POA: Diagnosis not present

## 2023-02-10 DIAGNOSIS — M79675 Pain in left toe(s): Secondary | ICD-10-CM | POA: Diagnosis not present

## 2023-02-21 DIAGNOSIS — R400 Somnolence: Secondary | ICD-10-CM | POA: Diagnosis not present

## 2023-02-21 DIAGNOSIS — G4733 Obstructive sleep apnea (adult) (pediatric): Secondary | ICD-10-CM | POA: Diagnosis not present

## 2023-03-24 DIAGNOSIS — R400 Somnolence: Secondary | ICD-10-CM | POA: Diagnosis not present

## 2023-03-24 DIAGNOSIS — G4733 Obstructive sleep apnea (adult) (pediatric): Secondary | ICD-10-CM | POA: Diagnosis not present

## 2023-03-25 DIAGNOSIS — R0981 Nasal congestion: Secondary | ICD-10-CM | POA: Diagnosis not present

## 2023-03-25 DIAGNOSIS — G4733 Obstructive sleep apnea (adult) (pediatric): Secondary | ICD-10-CM | POA: Diagnosis not present

## 2023-04-23 DIAGNOSIS — R400 Somnolence: Secondary | ICD-10-CM | POA: Diagnosis not present

## 2023-04-23 DIAGNOSIS — G4733 Obstructive sleep apnea (adult) (pediatric): Secondary | ICD-10-CM | POA: Diagnosis not present

## 2023-05-07 DIAGNOSIS — G4733 Obstructive sleep apnea (adult) (pediatric): Secondary | ICD-10-CM | POA: Diagnosis not present

## 2023-05-07 DIAGNOSIS — R400 Somnolence: Secondary | ICD-10-CM | POA: Diagnosis not present

## 2023-08-27 DIAGNOSIS — Z23 Encounter for immunization: Secondary | ICD-10-CM | POA: Diagnosis not present

## 2023-10-02 DIAGNOSIS — E785 Hyperlipidemia, unspecified: Secondary | ICD-10-CM | POA: Diagnosis not present

## 2023-10-02 DIAGNOSIS — Z125 Encounter for screening for malignant neoplasm of prostate: Secondary | ICD-10-CM | POA: Diagnosis not present

## 2023-10-02 DIAGNOSIS — E790 Hyperuricemia without signs of inflammatory arthritis and tophaceous disease: Secondary | ICD-10-CM | POA: Diagnosis not present

## 2023-10-02 DIAGNOSIS — Z0000 Encounter for general adult medical examination without abnormal findings: Secondary | ICD-10-CM | POA: Diagnosis not present

## 2023-10-02 DIAGNOSIS — M25512 Pain in left shoulder: Secondary | ICD-10-CM | POA: Diagnosis not present

## 2023-10-24 DIAGNOSIS — R400 Somnolence: Secondary | ICD-10-CM | POA: Diagnosis not present

## 2023-10-24 DIAGNOSIS — G4733 Obstructive sleep apnea (adult) (pediatric): Secondary | ICD-10-CM | POA: Diagnosis not present

## 2024-03-24 DIAGNOSIS — R600 Localized edema: Secondary | ICD-10-CM | POA: Diagnosis not present

## 2024-03-24 DIAGNOSIS — L729 Follicular cyst of the skin and subcutaneous tissue, unspecified: Secondary | ICD-10-CM | POA: Diagnosis not present

## 2024-03-24 DIAGNOSIS — M79645 Pain in left finger(s): Secondary | ICD-10-CM | POA: Diagnosis not present

## 2024-08-18 DIAGNOSIS — Z23 Encounter for immunization: Secondary | ICD-10-CM | POA: Diagnosis not present

## 2024-10-08 DIAGNOSIS — H9193 Unspecified hearing loss, bilateral: Secondary | ICD-10-CM | POA: Diagnosis not present

## 2024-10-08 DIAGNOSIS — E785 Hyperlipidemia, unspecified: Secondary | ICD-10-CM | POA: Diagnosis not present

## 2024-10-08 DIAGNOSIS — Z6841 Body Mass Index (BMI) 40.0 and over, adult: Secondary | ICD-10-CM | POA: Diagnosis not present

## 2024-10-08 DIAGNOSIS — E66813 Obesity, class 3: Secondary | ICD-10-CM | POA: Diagnosis not present

## 2024-10-08 DIAGNOSIS — Z125 Encounter for screening for malignant neoplasm of prostate: Secondary | ICD-10-CM | POA: Diagnosis not present

## 2024-10-08 DIAGNOSIS — Z0000 Encounter for general adult medical examination without abnormal findings: Secondary | ICD-10-CM | POA: Diagnosis not present

## 2024-11-02 DIAGNOSIS — R400 Somnolence: Secondary | ICD-10-CM | POA: Diagnosis not present

## 2024-11-02 DIAGNOSIS — G4733 Obstructive sleep apnea (adult) (pediatric): Secondary | ICD-10-CM | POA: Diagnosis not present
